# Patient Record
Sex: Male | Born: 1970 | Race: White | Hispanic: No | Marital: Married
Health system: Southern US, Community
[De-identification: ages and names within clinical notes are randomized; demographics above are authoritative.]

---

## 2003-06-15 ENCOUNTER — Emergency Department (HOSPITAL_COMMUNITY): Admission: EM | Admit: 2003-06-15 | Discharge: 2003-06-16 | Payer: Self-pay | Admitting: Emergency Medicine

## 2003-06-16 ENCOUNTER — Encounter: Payer: Self-pay | Admitting: Emergency Medicine

## 2004-02-28 ENCOUNTER — Inpatient Hospital Stay (HOSPITAL_COMMUNITY): Admission: AD | Admit: 2004-02-28 | Discharge: 2004-03-03 | Payer: Self-pay | Admitting: Orthopedic Surgery

## 2005-10-31 IMAGING — CT CT EXTREM UP W/ CM*L*
1 series · 12 of 14 positions shown, 15 images · IV contrast (omnipaque)
Comparison: none

CLINICAL DATA: Patient was injecting steroids, increased swelling and redness.  Question abscess.  
 CT OF THE RIGHT UPPER EXTREMITY
 Scans were obtained axially of the right upper extremity during IV contrast enhancement with 75 cc of Omnipaque 300.  There is an abscess associated with the triceps muscle laterally located.  This measures 7.0 X 9.3 X 10.0 cm in size.  This contains a small amount of air.  Bone window settings demonstrate no destructive changes. 
 IMPRESSION
 Soft tissue abscess involving the right triceps muscle laterally located and measuring 7.0 X 9.3 X 10.0 cm in size.  This contains a small amount of air.

[Series 3: upper extremity · axial · 0.55mm/px · z∈[-325,-15]mm · 12 of 74 slices shown, 15 images]
[im 6/74  soft-tissue]
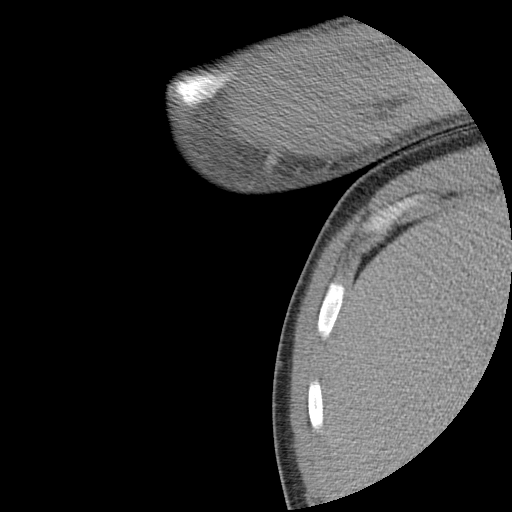
[im 6/74  bone]
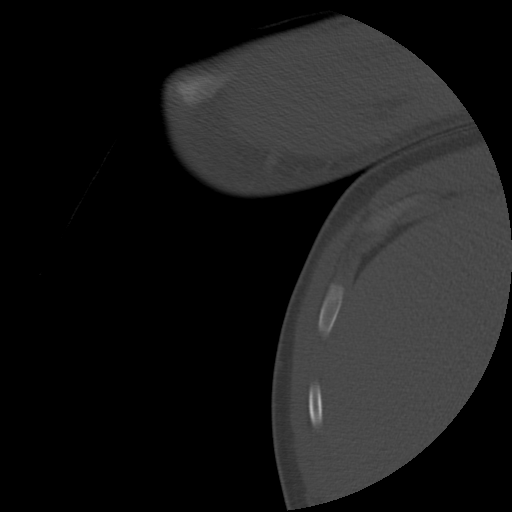
[im 12/74  bone]
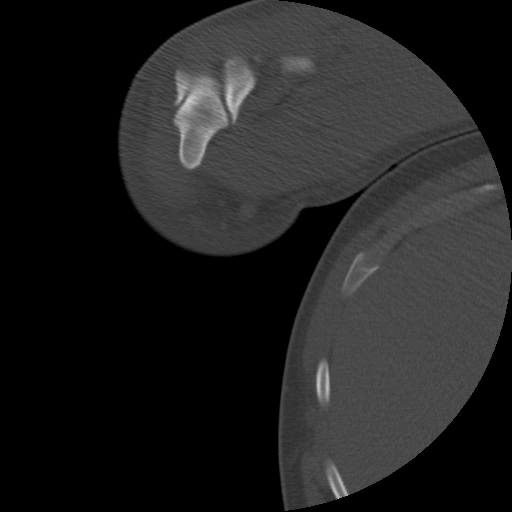
[im 17/74  bone]
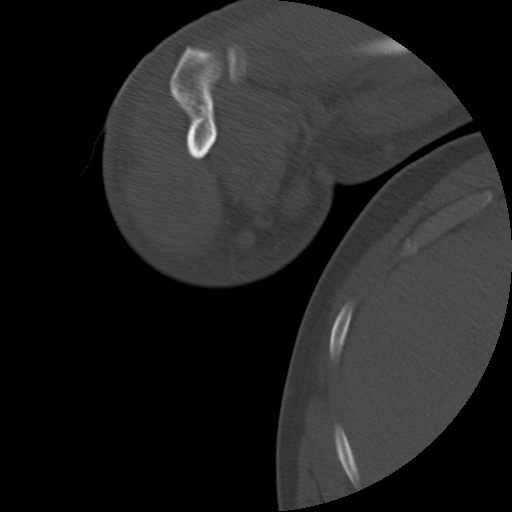
[im 23/74  bone]
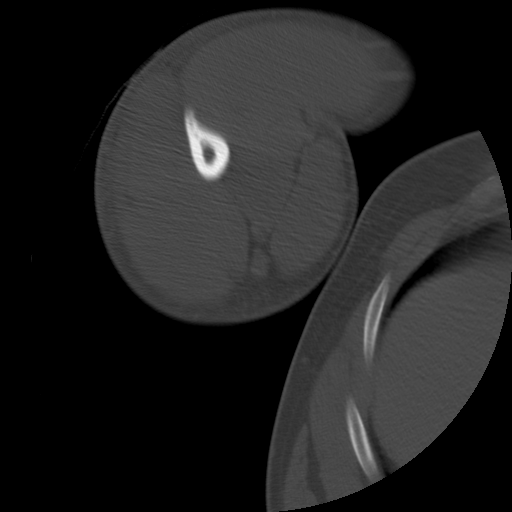
[im 29/74  soft-tissue]
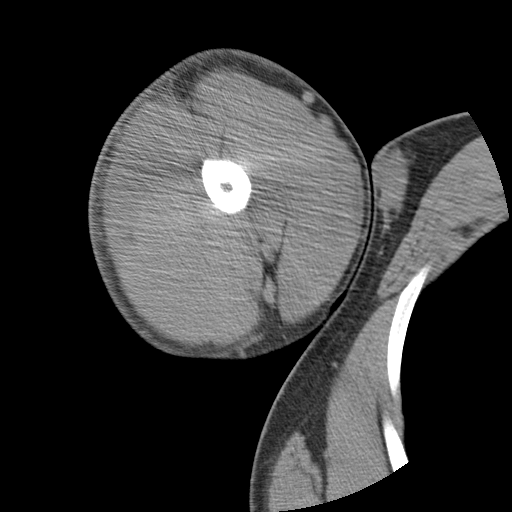
[im 29/74  bone]
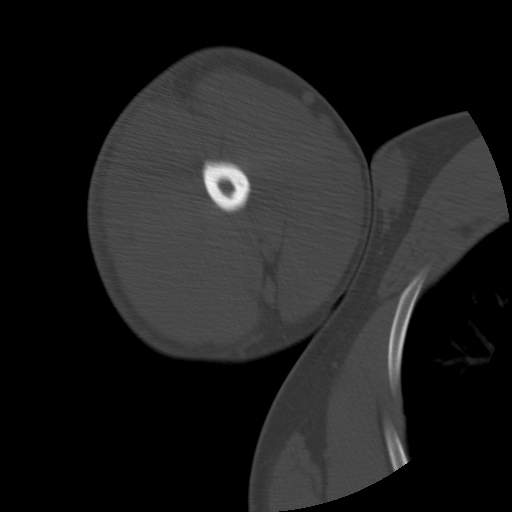
[im 34/74  bone]
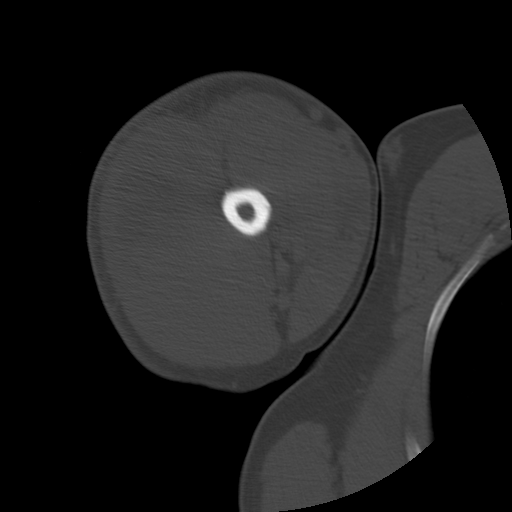
[im 40/74  bone]
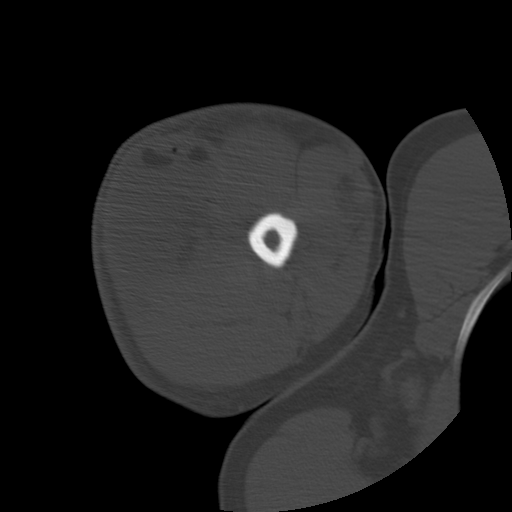
[im 45/74  bone]
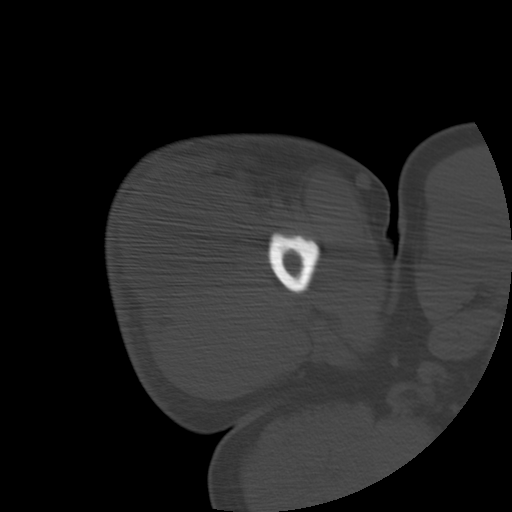
[im 51/74  soft-tissue]
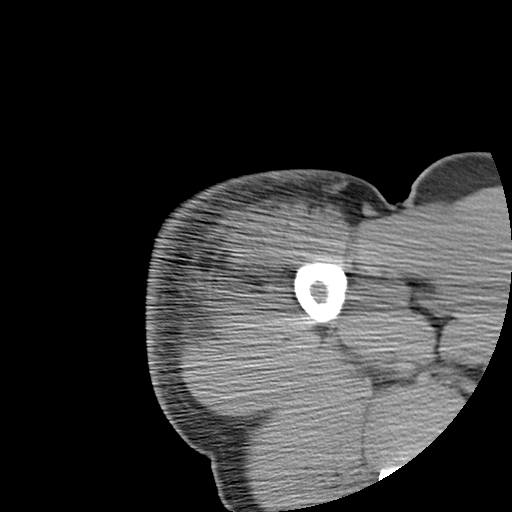
[im 51/74  bone]
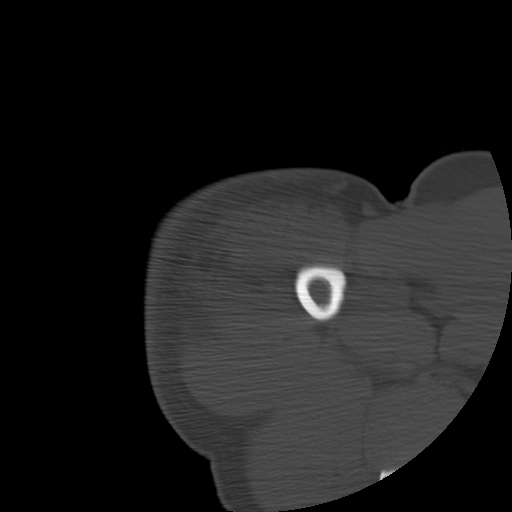
[im 57/74  bone]
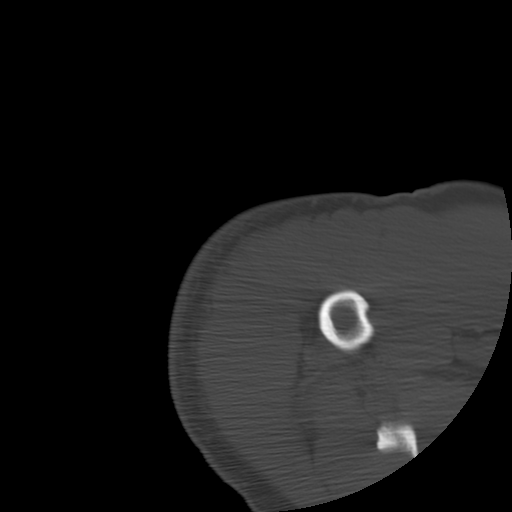
[im 62/74  bone]
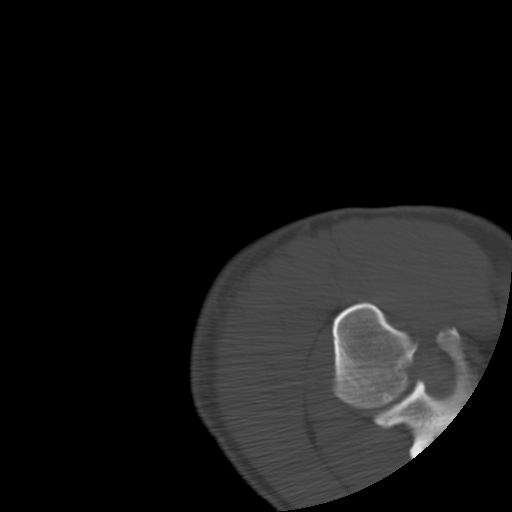
[im 68/74  bone]
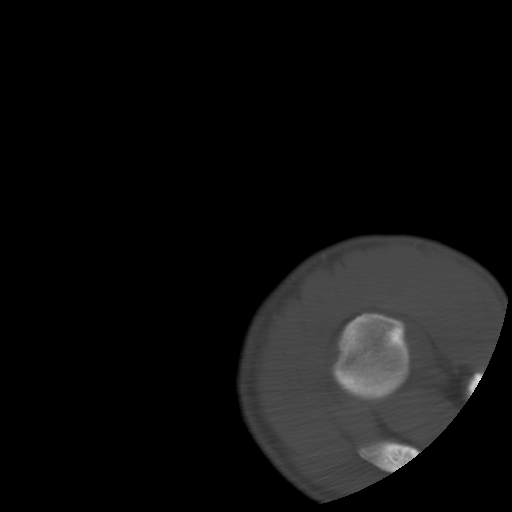

[12 of 14 positions shown; findings below may reference images not displayed]

## 2005-10-31 IMAGING — CR DG CHEST 2V
2 series · 2 of 2 positions shown · non-contrast
Comparison: none

CLINICAL DATA: Right upper arm abscess.  
 CHEST (TWO VIEWS)
 There is mild cardiomegaly.  There are no infiltrative or edematous changes.  The bronchial markings are mildly accentuated.  
 IMPRESSION
 Mild cardiomegaly.  No evidence for active chest disease.

[view not recorded (1 of 2)]
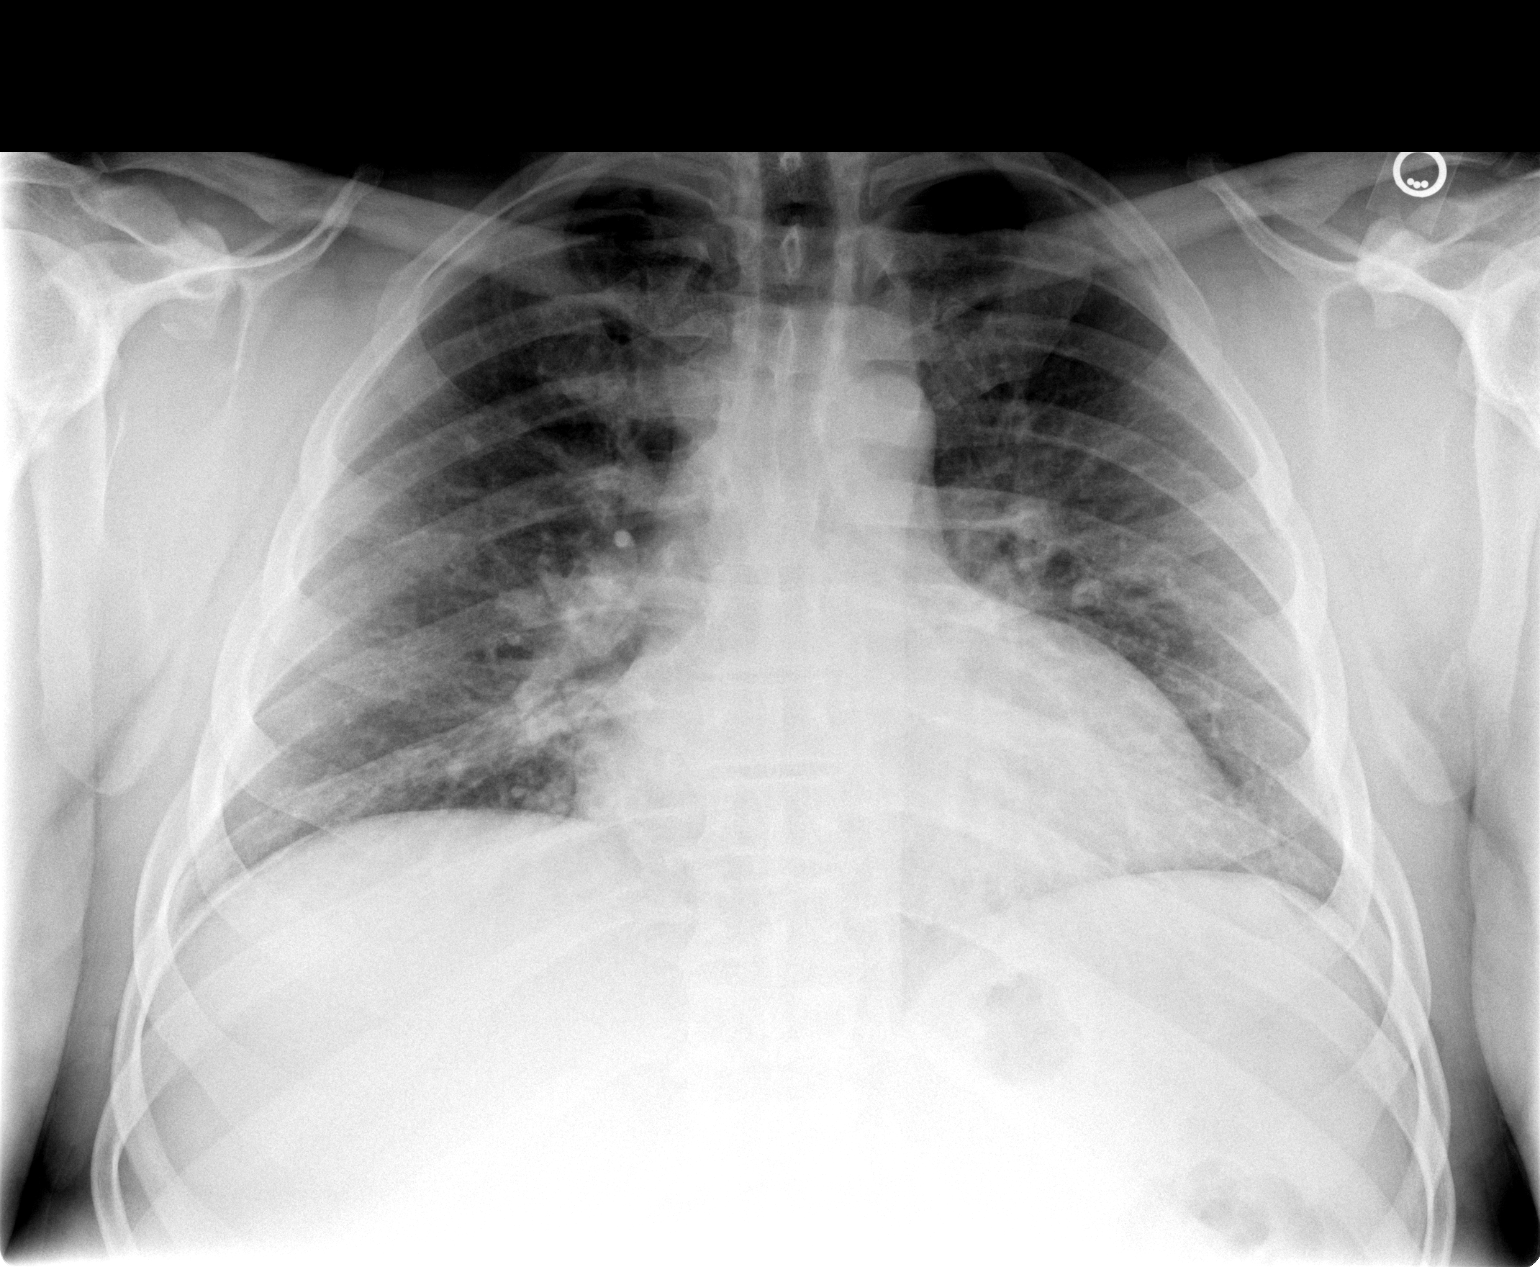

[view not recorded (2 of 2)]
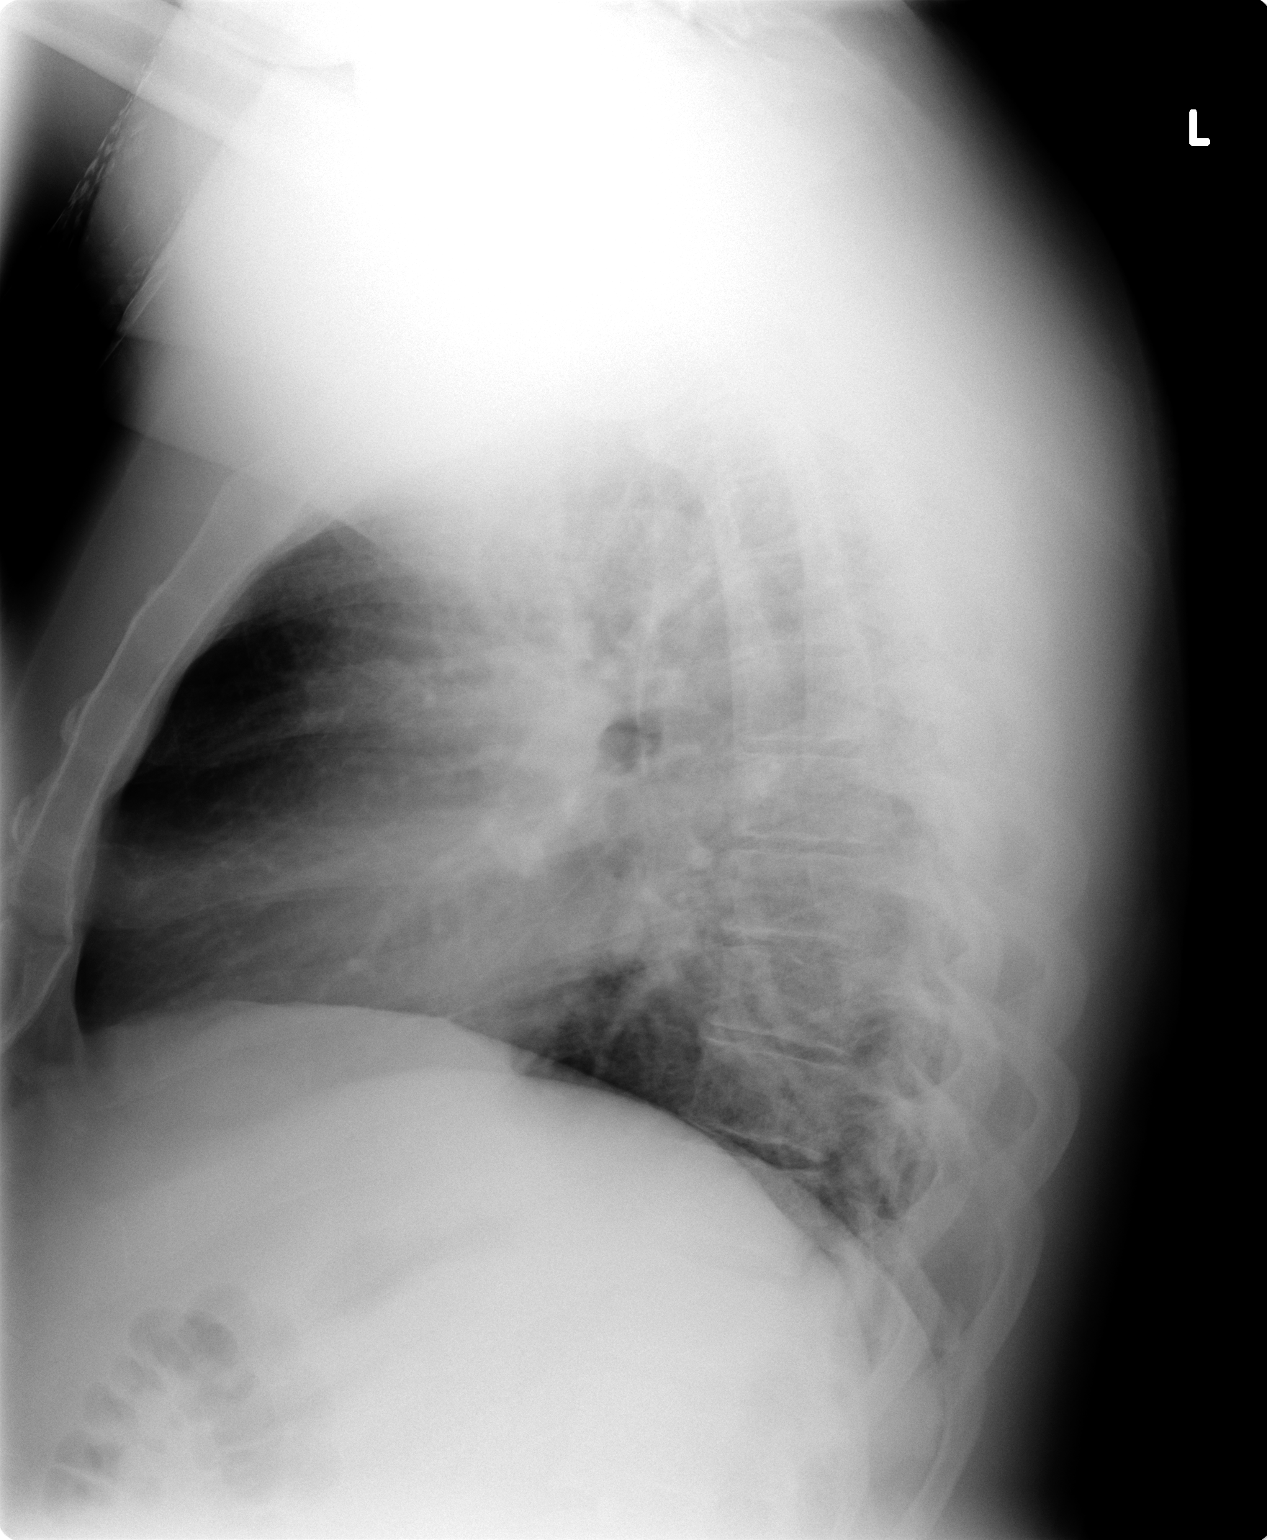

[2 of 2 positions shown; findings below may reference images not displayed]

## 2005-11-03 IMAGING — CR DG CHEST 1V PORT
1 series · 1 of 1 positions shown · non-contrast
Comparison: none

CLINICAL DATA: Right upper arm abscess. 
 PORTABLE CHEST ([DATE])
 Comparison to 02/28/04. 
 Cardiomegaly and pulmonary vascular congestion is noted.  Mild bibasilar atelectasis in this low volume film.  Left-sided PICC line tip overlying cavoatrial junction. 
 IMPRESSION
 Left-sided PICC line tip overlying cavoatrial junction. 
 Stable cardiomegaly and pulmonary vascular congestion.

[view not recorded]
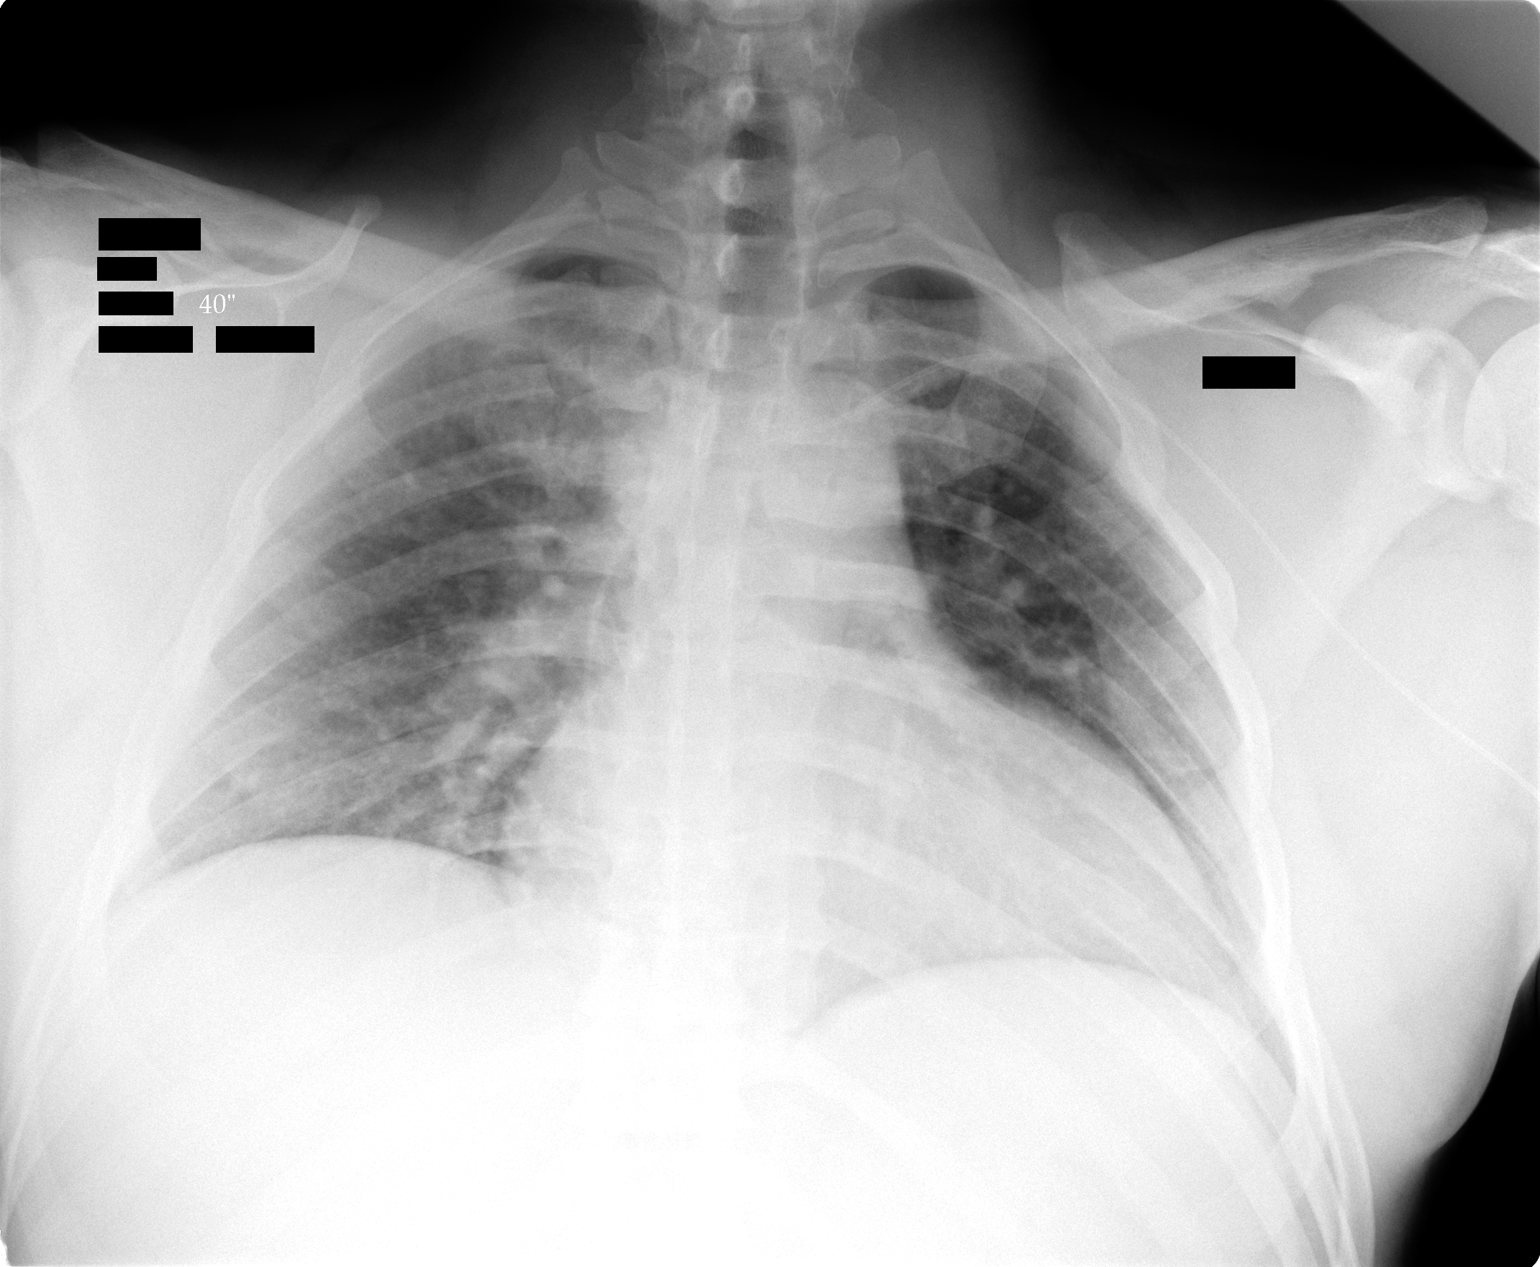

[1 of 1 positions shown; findings below may reference images not displayed]

## 2012-06-29 ENCOUNTER — Other Ambulatory Visit: Payer: Self-pay

## 2015-01-01 ENCOUNTER — Ambulatory Visit: Payer: Self-pay | Admitting: Family Medicine

## 2016-08-09 ENCOUNTER — Ambulatory Visit: Payer: Self-pay | Admitting: Podiatry

## 2016-08-09 DIAGNOSIS — B353 Tinea pedis: Secondary | ICD-10-CM

## 2016-08-09 DIAGNOSIS — B351 Tinea unguium: Secondary | ICD-10-CM

## 2016-08-09 NOTE — Progress Notes (Signed)
   Subjective:    Patient ID: Rennis PettyBrian Bobb, male    DOB: 17-Sep-1971, 45 y.o.   MRN: [id number]  HPI  45 year old male presents the office today for concerns of discolored, thickening of his toenails. His been ongoing for several years. He is tried multiple over-the-counter treatment for any significant relief of symptoms. He states that he is interested in laser treatment. Denies any redness or drainage from the toenail sites. He has no other complaints today.  Review of Systems  All other systems reviewed and are negative.      Objective:   Physical Exam General: AAO x3, NAD  Dermatological: There is mild interdigital tinea pedis present the left foot. Nails appear to be hypertrophic, dystrophic, discolored with yellow discoloration to toenails. There is no tenderness in nails there is no surrounding redness or drainage or any signs of infection. Open lesions or pre-ulcerative lesions identified.  Vascular: Dorsalis Pedis artery and Posterior Tibial artery pedal pulses are 2/4 bilateral with immedate capillary fill time. Pedal hair growth present. No varicosities and no lower extremity edema present bilateral. There is no pain with calf compression, swelling, warmth, erythema.   Neruologic: Grossly intact via light touch bilateral.  Protective threshold with Semmes Wienstein monofilament intact to all pedal sites bilateral.   Musculoskeletal: No gross boney pedal deformities bilateral. No pain, crepitus, or limitation noted with foot and ankle range of motion bilateral. Muscular strength 5/5 in all groups tested bilateral.  Gait: Unassisted, Nonantalgic.      Assessment & Plan:  45 year old male onychomycosis, tinea pedis -Treatment options discussed including all alternatives, risks, and complications -Etiology of symptoms were discussed -He'll start over-the-counter treatment for tinea pedis. This does not resolve the next 2 weeks call the office -Discussed multiple treatment options  for onychomycosis. He was proceeded laser treatment today. I discussed with him this is not a guarantee of resolution symptoms and he understood this. He'll also start topical treatment for onychomycosis. -Laserering of Toenails was carried out at today's visit via Q-switch YAG laser by [employer] at continuous on the 10 digit toenails.  Patient and staff were wearing appropriate laser protective goggles/eyewear Laser device was tested prior to use and safety protocols were followed Frequency of 5 Hz, Level 4, Joules 1.3 delivered. The patient tolerated the lasering well without any complications. They were encouraged to call the office with any questions, concerns, change in symptoms.  *give him formula 3 next appointment  Ovid CurdMatthew Wagoner, DPM

## 2016-09-01 ENCOUNTER — Ambulatory Visit: Payer: Self-pay

## 2016-09-01 DIAGNOSIS — B351 Tinea unguium: Secondary | ICD-10-CM

## 2016-09-01 NOTE — Progress Notes (Signed)
Pt presents with mycotic infection of nails hallux bilateral  All other systems are negative  Laser therapy administered to affected nails 1-5 bilateral  and tolerated well. All safety precautions were in place. He is to follow up in 1 month for 3 of possible 6 treatments

## 2016-09-06 DIAGNOSIS — B351 Tinea unguium: Secondary | ICD-10-CM

## 2016-09-29 ENCOUNTER — Ambulatory Visit: Payer: Self-pay

## 2016-09-29 DIAGNOSIS — B351 Tinea unguium: Secondary | ICD-10-CM

## 2016-09-29 NOTE — Progress Notes (Signed)
Pt presents with mycotic infection of nails hallux bilateral  All other systems are negative  Laser therapy administered to affected nails 1-5 bilateral  and tolerated well. All safety precautions were in place. He is to follow up in 1 month for 4 of possible 6 treatments

## 2016-10-27 ENCOUNTER — Ambulatory Visit: Payer: Self-pay

## 2016-10-27 DIAGNOSIS — B351 Tinea unguium: Secondary | ICD-10-CM

## 2016-10-29 NOTE — Progress Notes (Signed)
Pt presents with mycotic infection of nails hallux bilateral  All other systems are negative  Laser therapy administered to affected nails 1-5 bilateral  and tolerated well. All safety precautions were in place. He is to follow up in 1 month for 5 of possible 6 treatments

## 2016-11-24 ENCOUNTER — Ambulatory Visit: Payer: Self-pay

## 2016-11-24 DIAGNOSIS — B351 Tinea unguium: Secondary | ICD-10-CM

## 2016-11-24 NOTE — Progress Notes (Signed)
Pt presents with mycotic infection of nails hallux bilateral  All other systems are negative  Laser therapy administered to affected nails 1-5 bilateral  and tolerated well. All safety precautions were in place. He is to follow up in 1 month for 6th and final treatment

## 2016-12-28 ENCOUNTER — Ambulatory Visit: Payer: Self-pay

## 2016-12-28 DIAGNOSIS — R52 Pain, unspecified: Secondary | ICD-10-CM

## 2016-12-28 DIAGNOSIS — B351 Tinea unguium: Secondary | ICD-10-CM

## 2016-12-31 NOTE — Progress Notes (Signed)
Pt presents with mycotic infection of nails hallux bilateral  All other systems are negative  Laser therapy administered to affected nails 1-5 bilateral  and tolerated well. All safety precautions were in place. He is to follow up in 1 month for 6th and final treatment

## 2017-01-27 ENCOUNTER — Ambulatory Visit (INDEPENDENT_AMBULATORY_CARE_PROVIDER_SITE_OTHER): Payer: Self-pay

## 2017-01-27 DIAGNOSIS — B351 Tinea unguium: Secondary | ICD-10-CM

## 2017-02-01 NOTE — Progress Notes (Signed)
Pt presents with mycotic infection of nails hallux bilateral  All other systems are negative  Laser therapy administered to affected nails 1-5 bilateral  and tolerated well. All safety precautions were in place. He is to follow up in 1 month for 7th and final treatmentt

## 2017-04-07 ENCOUNTER — Other Ambulatory Visit: Payer: Self-pay | Admitting: Podiatry

## 2017-05-12 ENCOUNTER — Ambulatory Visit: Payer: Self-pay | Admitting: Podiatry

## 2017-05-18 ENCOUNTER — Ambulatory Visit (INDEPENDENT_AMBULATORY_CARE_PROVIDER_SITE_OTHER): Payer: Self-pay | Admitting: Podiatry

## 2017-05-18 DIAGNOSIS — B351 Tinea unguium: Secondary | ICD-10-CM

## 2017-05-19 NOTE — Progress Notes (Signed)
Pt presents with mycotic infection of nails hallux bilateral  All other systems are negative  Laser therapy administered to affected nails 1-5 bilateral  and tolerated well. All safety precautions were in place. He is to follow up in 1 month for 8th treatment

## 2017-06-15 ENCOUNTER — Other Ambulatory Visit: Payer: Self-pay

## 2017-06-15 DIAGNOSIS — B351 Tinea unguium: Secondary | ICD-10-CM

## 2017-07-20 ENCOUNTER — Ambulatory Visit (INDEPENDENT_AMBULATORY_CARE_PROVIDER_SITE_OTHER): Payer: Self-pay | Admitting: Podiatry

## 2017-07-20 DIAGNOSIS — B351 Tinea unguium: Secondary | ICD-10-CM

## 2017-07-20 NOTE — Progress Notes (Signed)
Pt presents with mycotic infection of nails hallux bilateral  All other systems are negative  Laser therapy administered to affected nails 1-5 bilateral  and tolerated well. All safety precautions were in place. He is to follow up prn
